# Patient Record
Sex: Female | Born: 1995 | Hispanic: Yes | Marital: Single | Smoking: Never smoker
Health system: Southern US, Community
[De-identification: ages and names within clinical notes are randomized; demographics above are authoritative.]

## PROBLEM LIST (undated history)

## (undated) DIAGNOSIS — J45909 Unspecified asthma, uncomplicated: Secondary | ICD-10-CM

## (undated) DIAGNOSIS — L0591 Pilonidal cyst without abscess: Secondary | ICD-10-CM

## (undated) DIAGNOSIS — Z8709 Personal history of other diseases of the respiratory system: Secondary | ICD-10-CM

## (undated) DIAGNOSIS — N926 Irregular menstruation, unspecified: Secondary | ICD-10-CM

## (undated) HISTORY — DX: Unspecified asthma, uncomplicated: J45.909

## (undated) HISTORY — PX: PILONIDAL CYST / SINUS EXCISION: SUR543

---
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
## 2017-06-07 ENCOUNTER — Encounter (HOSPITAL_COMMUNITY): Payer: Self-pay

## 2017-06-07 ENCOUNTER — Emergency Department (HOSPITAL_COMMUNITY)
Admission: EM | Admit: 2017-06-07 | Discharge: 2017-06-07 | Disposition: A | Discharge status: Home / Self Care | Payer: Medicaid Other | Attending: Emergency Medicine | Admitting: Emergency Medicine

## 2017-06-07 DIAGNOSIS — G43909 Migraine, unspecified, not intractable, without status migrainosus: Secondary | ICD-10-CM | POA: Diagnosis present

## 2017-06-07 DIAGNOSIS — R51 Headache: Secondary | ICD-10-CM | POA: Diagnosis not present

## 2017-06-07 DIAGNOSIS — R519 Headache, unspecified: Secondary | ICD-10-CM

## 2017-06-07 MED ORDER — IBUPROFEN 400 MG PO TABS
600.0000 mg | ORAL_TABLET | Freq: Once | ORAL | Status: AC
  Administered 2017-06-07: 600 mg via ORAL
  Filled 2017-06-07: qty 1

## 2017-06-07 MED ORDER — ONDANSETRON 4 MG PO TBDP
8.0000 mg | ORAL_TABLET | Freq: Once | ORAL | Status: AC
  Administered 2017-06-07: 8 mg via ORAL
  Filled 2017-06-07: qty 2

## 2017-06-07 NOTE — ED Notes (Signed)
Pt given 8oz Water for PO challenge

## 2017-06-07 NOTE — ED Triage Notes (Signed)
Pt here for headache, sts that hurts to move eyes and has had some nausea.

## 2017-06-07 NOTE — Discharge Instructions (Signed)
Take Ibuprofen 400mg  three times a day for headache Drink plenty of fluids Follow up with your doctor Return for worsening symptoms

## 2017-06-07 NOTE — ED Provider Notes (Signed)
[facility] Provider Note   CSN: [id number] Arrival date & time: 06/07/17  0439     History   Chief Complaint Chief Complaint  Patient presents with  . Migraine    HPI Carrie Morton is a 21 y.o. female who presents with a headache and nausea. PMH significant for underweight, insomnia. She states she has frequent headaches but this feels different from previous because it is in the back of her head and she has noticed a "lump" behind her head. She reports associated neck pain, pain with eye movement, and nausea. She denies fever, LOC, trauma, neck stiffness, acute onset, worst headache of life. She has not tried any medicines OTC. She denies URI symptoms, abdominal pain, or vomiting. She states headache is currently going away.   HPI  Past Medical History:  Diagnosis Date  . Asthma   . History of asthma    as a child  . Irregular periods   . Pilonidal cyst 07/2013    Patient Active Problem List   Diagnosis Date Noted  . Underweight 05/16/2015  . Insomnia 05/16/2015    Past Surgical History:  Procedure Laterality Date  . PILONIDAL CYST EXCISION N/A 08/14/2013   Procedure: EXCISION PILONIDAL CYST PEDIATRIC;  Surgeon: Judie PetitM. Leonia CoronaShuaib Farooqui, MD;  [facility];  Service: Pediatrics;  Laterality: N/A;    OB History    No data available       Home Medications    Prior to Admission medications   Medication Sig Start Date End Date Taking? Authorizing Provider  cetirizine (ZYRTEC) 10 MG tablet Take 1 tablet (10 mg total) by mouth daily as needed for allergies. Patient not taking: Reported on 06/07/2017 07/06/15   Voncille LoEttefagh, Kate, MD  omeprazole (PRILOSEC) 20 MG capsule Take 1 capsule (20 mg total) by mouth daily. Patient not taking: Reported on 06/07/2017 09/28/16   Antony MaduraHumes, Amauria Younts, PA-C  ondansetron (ZOFRAN ODT) 4 MG disintegrating tablet Take 1 tablet (4 mg total) by mouth every 8 (eight) hours as needed for nausea or vomiting. Patient not  taking: Reported on 06/07/2017 06/10/16   Maloy, Illene RegulusBrittany Nicole, NP  promethazine (PHENERGAN) 25 MG tablet Take 1 tablet (25 mg total) by mouth every 6 (six) hours as needed for nausea or vomiting. Patient not taking: Reported on 06/07/2017 09/28/16   Antony MaduraHumes, Kendrick Remigio, PA-C    Family History Family History  Problem Relation Age of Onset  . Hypertension Maternal Grandmother     Social History Social History  Substance Use Topics  . Smoking status: Never Smoker  . Smokeless tobacco: Never Used  . Alcohol use No     Allergies   Patient has no known allergies.   Review of Systems Review of Systems  Constitutional: Negative for chills and fever.  HENT: Negative for congestion, rhinorrhea and sore throat.   Eyes: Positive for pain (with movement). Negative for photophobia and visual disturbance.  Gastrointestinal: Positive for nausea. Negative for abdominal pain and vomiting.  Musculoskeletal: Positive for neck pain. Negative for neck stiffness.  Skin:       Lump behind neck  Neurological: Negative for syncope.     Physical Exam Updated Vital Signs BP 122/86 (BP Location: Right Arm)   Pulse 88   Temp 99.3 F (37.4 C) (Oral)   Resp 18   Ht 5\' 6"  (1.676 m)   Wt 45.4 kg (100 lb)   SpO2 100%   BMI 16.14 kg/m   Physical Exam  Constitutional: She is oriented to person, place,  and time. She appears well-developed and well-nourished. No distress.  Thin female in NAD lying on stretcher  HENT:  Head: Normocephalic and atraumatic.  Right Ear: Hearing, tympanic membrane, external ear and ear canal normal.  Left Ear: Hearing, external ear and ear canal normal.  Mouth/Throat: Uvula is midline, oropharynx is clear and moist and mucous membranes are normal.  Cerumen impaction of L TM  Eyes: Pupils are equal, round, and reactive to light. Conjunctivae are normal. Right eye exhibits no discharge. Left eye exhibits no discharge. No scleral icterus.  Neck: Normal range of motion.     Tender mobile lymph node over left mid-cervical region  Cardiovascular: Normal rate.   Pulmonary/Chest: Effort normal. No respiratory distress.  Abdominal: She exhibits no distension.  Neurological: She is alert and oriented to person, place, and time.  Lying on stretcher in NAD. GCS 15. Speaks in a clear voice. Cranial nerves II through XII grossly intact. 5/5 strength in all extremities. Sensation fully intact.  Bilateral finger-to-nose intact. Ambulatory    Skin: Skin is warm and dry.  Psychiatric: She has a normal mood and affect. Her behavior is normal.  Nursing note and vitals reviewed.    ED Treatments / Results  Labs (all labs ordered are listed, but only abnormal results are displayed) Labs Reviewed - No data to display  EKG  EKG Interpretation None       Radiology No results found.  Procedures Procedures (including critical care time)  Medications Ordered in ED Medications  ibuprofen (ADVIL,MOTRIN) tablet 600 mg (600 mg Oral Given 06/07/17 0621)  ondansetron (ZOFRAN-ODT) disintegrating tablet 8 mg (8 mg Oral Given 06/07/17 [id number])     Initial Impression / Assessment and Plan / ED Course  I have reviewed the triage vital signs and the nursing notes.  Pertinent labs & imaging results that were available during my care of the patient were reviewed by me and considered in my medical decision making (see chart for details).  21 year old female presents with a headache. Vitals are normal. Neuro exam unremarkable. Will give Ibuprofen and Zofran and PO challenge.  PO challenge was tolerated. She reports headache is better. Advised Ibuprofen and plenty of fluids. Follow up with PCP.   Final Clinical Impressions(s) / ED Diagnoses   Final diagnoses:  Bad headache    New Prescriptions New Prescriptions   No medications on file     Beryle QuantGekas, Jaileen Janelle Marie, PA-C 06/07/17 [phone]    Dione BoozeGlick, David, MD 06/07/17 [phone]

## 2017-06-29 ENCOUNTER — Encounter (HOSPITAL_COMMUNITY): Payer: Self-pay | Admitting: Emergency Medicine

## 2017-06-29 ENCOUNTER — Emergency Department (HOSPITAL_COMMUNITY): Payer: Medicaid Other

## 2017-06-29 ENCOUNTER — Emergency Department (HOSPITAL_COMMUNITY)
Admission: EM | Admit: 2017-06-29 | Discharge: 2017-06-30 | Disposition: A | Discharge status: Home / Self Care | Payer: Medicaid Other | Attending: Emergency Medicine | Admitting: Emergency Medicine

## 2017-06-29 DIAGNOSIS — Y93I9 Activity, other involving external motion: Secondary | ICD-10-CM | POA: Insufficient documentation

## 2017-06-29 DIAGNOSIS — S6991XA Unspecified injury of right wrist, hand and finger(s), initial encounter: Secondary | ICD-10-CM | POA: Diagnosis present

## 2017-06-29 DIAGNOSIS — J45909 Unspecified asthma, uncomplicated: Secondary | ICD-10-CM | POA: Diagnosis not present

## 2017-06-29 DIAGNOSIS — Y929 Unspecified place or not applicable: Secondary | ICD-10-CM | POA: Diagnosis not present

## 2017-06-29 DIAGNOSIS — S52501A Unspecified fracture of the lower end of right radius, initial encounter for closed fracture: Secondary | ICD-10-CM | POA: Diagnosis not present

## 2017-06-29 DIAGNOSIS — Y999 Unspecified external cause status: Secondary | ICD-10-CM | POA: Diagnosis not present

## 2017-06-29 DIAGNOSIS — S098XXA Other specified injuries of head, initial encounter: Secondary | ICD-10-CM | POA: Diagnosis not present

## 2017-06-29 NOTE — ED Notes (Signed)
Bed: WA07 Expected date:  Expected time:  Means of arrival:  Comments: 
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
## 2017-06-30 ENCOUNTER — Encounter (HOSPITAL_COMMUNITY): Payer: Self-pay

## 2017-06-30 MED ORDER — IBUPROFEN 600 MG PO TABS: 600.0000 mg | ORAL_TABLET | Freq: Four times a day (QID) | ORAL | 0 refills | Status: DC | PRN

## 2017-06-30 MED ORDER — IBUPROFEN 800 MG PO TABS
800.0000 mg | ORAL_TABLET | Freq: Once | ORAL | Status: AC
  Administered 2017-06-30: 800 mg via ORAL
  Filled 2017-06-30: qty 1
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
## 2017-06-30 NOTE — Progress Notes (Signed)
Orthopedic Tech Progress Note Patient Details:  Carrie Morton 17-Dec-1995 [id number]  Ortho Devices Type of Ortho Device: Sugartong splint Ortho Device/Splint Location: rue Ortho Device/Splint Interventions: Ordered, Application, Adjustment   Trinna Post 06/30/2017, 1:25 AM
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
## 2018-02-12 ENCOUNTER — Emergency Department (HOSPITAL_COMMUNITY): Payer: Medicaid Other

## 2018-02-12 ENCOUNTER — Encounter (HOSPITAL_COMMUNITY): Payer: Self-pay | Admitting: Emergency Medicine

## 2018-02-12 ENCOUNTER — Emergency Department (HOSPITAL_COMMUNITY)
Admission: EM | Admit: 2018-02-12 | Discharge: 2018-02-12 | Disposition: A | Discharge status: Home / Self Care | Payer: Medicaid Other | Attending: Emergency Medicine | Admitting: Emergency Medicine

## 2018-02-12 DIAGNOSIS — Y9301 Activity, walking, marching and hiking: Secondary | ICD-10-CM | POA: Diagnosis not present

## 2018-02-12 DIAGNOSIS — Y999 Unspecified external cause status: Secondary | ICD-10-CM | POA: Insufficient documentation

## 2018-02-12 DIAGNOSIS — W010XXA Fall on same level from slipping, tripping and stumbling without subsequent striking against object, initial encounter: Secondary | ICD-10-CM | POA: Diagnosis not present

## 2018-02-12 DIAGNOSIS — J45909 Unspecified asthma, uncomplicated: Secondary | ICD-10-CM | POA: Insufficient documentation

## 2018-02-12 DIAGNOSIS — Y929 Unspecified place or not applicable: Secondary | ICD-10-CM | POA: Insufficient documentation

## 2018-02-12 DIAGNOSIS — S52515A Nondisplaced fracture of left radial styloid process, initial encounter for closed fracture: Secondary | ICD-10-CM | POA: Insufficient documentation

## 2018-02-12 DIAGNOSIS — S6992XA Unspecified injury of left wrist, hand and finger(s), initial encounter: Secondary | ICD-10-CM | POA: Diagnosis present

## 2018-02-12 DIAGNOSIS — Z79899 Other long term (current) drug therapy: Secondary | ICD-10-CM | POA: Insufficient documentation

## 2018-02-12 MED ORDER — TRAMADOL HCL 50 MG PO TABS: 50.0000 mg | ORAL_TABLET | Freq: Four times a day (QID) | ORAL | 0 refills | Status: DC | PRN

## 2018-02-12 NOTE — Discharge Instructions (Addendum)
You were seen here today for left wrist pain.  You are found to have a radial styloid fracture. There is also possible that you have a transverse metaphyseal fracture but x-rays were not definitive. I have placed you in a sugar tong splint to allow for mobilization.  Please follow-up with hand specialist for further evaluation of your symptoms. Please follow cast and splint care at home. Elevate the extremity whenever you are not using it. Return sooner if you have any fever, numbness of your fingers, cannot move your fingers, changes in colors of your finger, redness of the skin surrounding the past or drainage coming from your cast.  For pain control you may take: 800mg  of ibuprofen (that is usually four 200mg  over the counter pills) up to 3 times a day (please take with food) and acetaminophen 975mg  (this is 3 normal strength, 325mg , over the counter pills) up to four times a day. Please do not take more than this. Do not drink alcohol or combine with other medications that have acetaminophen or Ibuprofen as an ingredient (Read the labels!).    For breakthrough pain you may take Ultram. Do not drink alcohol drive or operate heavy machinery when taking. You are being provided a prescription for opiates (also known as narcotics) for pain control on an ?as needed? basis.  Opiates can be addictive and should only be used when absolutely necessary for pain control when other alternatives do not work.  We recommend you only use them for the recommended amount of time and only as prescribed.  Please do not take with other sedative medications or alcohol.  Please do not drive, operate machinery, or make important decisions while taking opiates.  Please note that these medications can be addictive and have high abuse potential.  Please keep these medications locked away from children, teenagers or any family members with history of substance abuse. Additionally, these medications may cause constipation - take  over the counter stool softeners or add fiber to your diet to treat this (Metamucil, Psyllium Fiber, Colace, Miralax) Further refills will need to be obtained from your primary care doctor and will not be prescribed through the Emergency Department. You will test positive on most drug tests while taking this medication.
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
## 2018-02-12 NOTE — ED Notes (Signed)
Pt complains of wrist pain after falling and landing on L wrist. Some swelling and bruising noted to the L wrist.

## 2018-02-12 NOTE — ED Provider Notes (Signed)
[facility]   CSN: [id number] Arrival date & time: 02/12/18  1753     History   Chief Complaint Chief Complaint  Patient presents with  . Wrist Pain    HPI Carrie Morton is a 22 y.o. right-handed female who presents the emergency department today for left wrist pain.  Patient states that earlier this evening she was walking and tripped, going on her left wrist with the hand folded in.  She denies any head trauma or loss of consciousness.  She notes that she has been having pain on the radial aspect of the wrist and now has some swelling and ecchymosis associated with this.  He denies any numbness/tingling.  Her pain is worse with range of motion.  She has not taken any medication for this.  No open wounds.  She denies any hand pain.  HPI  Past Medical History:  Diagnosis Date  . Asthma   . History of asthma    as a child  . Irregular periods   . Pilonidal cyst 07/2013    Patient Active Problem List   Diagnosis Date Noted  . Underweight 05/16/2015  . Insomnia 05/16/2015    Past Surgical History:  Procedure Laterality Date  . PILONIDAL CYST / SINUS EXCISION    . PILONIDAL CYST EXCISION N/A 08/14/2013   Procedure: EXCISION PILONIDAL CYST PEDIATRIC;  Surgeon: Judie PetitM. Leonia CoronaShuaib Farooqui, MD;  Location:  [facility];  Service: Pediatrics;  Laterality: N/A;     OB History   None      Home Medications    Prior to Admission medications   Medication Sig Start Date End Date Taking? Authorizing Provider  ibuprofen (ADVIL,MOTRIN) 600 MG tablet Take 1 tablet (600 mg total) by mouth every 6 (six) hours as needed. 06/30/17   Antony MaduraHumes, Kelly, PA-C    Family History Family History  Problem Relation Age of Onset  . Hypertension Maternal Grandmother     Social History Social History   Tobacco Use  . Smoking status: Never Smoker  . Smokeless tobacco: Never Used  Substance Use Topics  . Alcohol use: No  .  Drug use: No     Allergies   Patient has no known allergies.   Review of Systems Review of Systems  All other systems reviewed and are negative.    Physical Exam Updated Vital Signs BP 108/81 (BP Location: Right Arm)   Pulse 94   Temp 98.3 F (36.8 C) (Oral)   Resp 18   SpO2 100%   Physical Exam  Constitutional: She appears well-developed and well-nourished.  HENT:  Head: Normocephalic and atraumatic.  Right Ear: External ear normal.  Left Ear: External ear normal.  Eyes: Conjunctivae are normal. Right eye exhibits no discharge. Left eye exhibits no discharge. No scleral icterus.  Cardiovascular:  Pulses:      Radial pulses are 2+ on the right side, and 2+ on the left side.  Pulmonary/Chest: Effort normal. No respiratory distress.  Musculoskeletal:  Left wrist: There is noted ecchymosis on the radial aspect of the distal wrist.  There is also tenderness palpation.  She notes pain with radial deviation as well as flexion and extension.  There is no open wounds noted.  Compartments soft. Left hand: No gross deformities, skin intact. Fingers appear normal.  No hand TTP. No snuffbox TTP. Finger adduction/abduction intact with 5/5 strength.  Thumb opposition intact. Full active and resisted ROM to flexion/extension at MCP, PIP and DIP of all  fingers.  FDS/FDP intact. Radial artery 2+ with <2sec cap refill. SILT in M/U/R distributions. Grip 5/5 strength.   Neurological: She is alert. She has normal strength. No sensory deficit.  Skin: Skin is warm and dry. Capillary refill takes less than 2 seconds. Ecchymosis noted. No rash noted. No erythema. No pallor.  Psychiatric: She has a normal mood and affect.  Nursing note and vitals reviewed.    ED Treatments / Results  Labs (all labs ordered are listed, but only abnormal results are displayed) Labs Reviewed - No data to display  EKG None  Radiology Dg Wrist Complete Left  Result Date: 02/12/2018 CLINICAL DATA:  Wrist pain  after fall onto outstretched hand. EXAM: LEFT WRIST - COMPLETE 3+ VIEW COMPARISON:  None. FINDINGS: Fracture of the radial styloid noted. There is also a lucent band in the distal radial metaphysis with some associated cortical irregularity which could be from a remote fracture or a subtle new transverse metaphyseal fracture. No additional fractures are identified. IMPRESSION: 1. Small radial styloid fracture. 2. Unusual appearance of the distal radial metaphysis with transverse indistinct band of lucency and some lateral cortical irregularity in the vicinity of the original growth plate. This could be due to an old fracture or conceivably an unusual new transverse metaphyseal fracture. Electronically Signed   By: Gaylyn Rong M.D.   On: 02/12/2018 19:14    Procedures Procedures (including critical care time) SPLINT APPLICATION Date/Time: 7:58 PM Authorized by: Jacinto Halim Consent: Verbal consent obtained. Risks and benefits: risks, benefits and alternatives were discussed Consent given by: patient Splint applied by: orthopedic technician Location details: left wrist Splint type: sugar tong splint Supplies used: sugar tong splint Post-procedure: The splinted body part was neurovascularly unchanged following the procedure. Patient tolerance: Patient tolerated the procedure well with no immediate complications.   Medications Ordered in ED Medications - No data to display   Initial Impression / Assessment and Plan / ED Course  I have reviewed the triage vital signs and the nursing notes.  Pertinent labs & imaging results that were available during my care of the patient were reviewed by me and considered in my medical decision making (see chart for details).     22 y.o. female with left, non-dominant wrist pain after fall today with hand.  No open wounds.  She is neurovascular intact.  Exam as above.  X-ray with radial styloid fracture.  There is also noted to be a possible new  transverse metaphyseal fracture.  Will place in a sugar tong splint to cover for both.  On my review does not appear to require reduction prior to splinting.  Patient offered pain medication department but denies.  Conservative therapy recommended and discussed.  Patient be discharged home with follow-up with hand (Gramig).  Strict return precautions are discussed.  Patient expresses understanding and agreement with plan.  Final Clinical Impressions(s) / ED Diagnoses   Final diagnoses:  Closed nondisplaced fracture of styloid process of left radius, initial encounter    ED Discharge Orders        Ordered    traMADol (ULTRAM) 50 MG tablet  Every 6 hours PRN     02/12/18 2000       Jacinto Halim, PA-C 02/12/18 2021    Melene Plan, DO 02/12/18 2044

## 2018-02-12 NOTE — Progress Notes (Signed)
Orthopedic Tech Progress Note Patient Details:  Carrie Morton 11/25/1995 [id number]  Ortho Devices Type of Ortho Device: Arm sling, Sugartong splint Ortho Device/Splint Location: left Ortho Device/Splint Interventions: Application   Post Interventions Patient Tolerated: Well Instructions Provided: Adjustment of device, Care of device   Alvina ChouWilliams, Sharita Bienaime C 02/12/2018, 8:17 PM

## 2018-02-12 NOTE — ED Provider Notes (Signed)
Patient placed in Quick Look pathway, seen and evaluated   Chief Complaint: wrist pain  HPI:   Carrie Morton is a 22 y.o. female who presents to the ED with left wrist pain s/p fall and landing wrong. Patient denies other injuries.   ROS: M/S: left wrist pain  Physical Exam:  BP 108/81 (BP Location: Right Arm)   Pulse 94   Temp 98.3 F (36.8 C) (Oral)   Resp 18   SpO2 100%    Gen: No distress  Neuro: Awake and Alert  Skin: Warm and dry  M/S: left wrist with swelling and tenderness to the radial aspect.     Focused Exam:    Initiation of care has begun. The patient has been counseled on the process, plan, and necessity for staying for the completion/evaluation, and the remainder of the medical screening examination    Janne Napoleoneese, Lorilei Horan M, NP 02/12/18 [id number]    Tilden Fossaees, Elizabeth, MD 02/13/18 [phone]

## 2018-02-12 NOTE — ED Notes (Signed)
Ortho tech paged  

## 2018-08-11 ENCOUNTER — Emergency Department (HOSPITAL_COMMUNITY)
Admission: EM | Admit: 2018-08-11 | Discharge: 2018-08-11 | Disposition: A | Discharge status: Home / Self Care | Payer: Medicaid Other | Attending: Emergency Medicine | Admitting: Emergency Medicine

## 2018-08-11 ENCOUNTER — Encounter (HOSPITAL_COMMUNITY): Payer: Self-pay | Admitting: Emergency Medicine

## 2018-08-11 ENCOUNTER — Emergency Department (HOSPITAL_COMMUNITY): Payer: Medicaid Other

## 2018-08-11 ENCOUNTER — Other Ambulatory Visit: Payer: Self-pay

## 2018-08-11 DIAGNOSIS — S62647A Nondisplaced fracture of proximal phalanx of left little finger, initial encounter for closed fracture: Secondary | ICD-10-CM | POA: Insufficient documentation

## 2018-08-11 DIAGNOSIS — S6992XA Unspecified injury of left wrist, hand and finger(s), initial encounter: Secondary | ICD-10-CM | POA: Diagnosis present

## 2018-08-11 DIAGNOSIS — Y929 Unspecified place or not applicable: Secondary | ICD-10-CM | POA: Diagnosis not present

## 2018-08-11 DIAGNOSIS — Z79899 Other long term (current) drug therapy: Secondary | ICD-10-CM | POA: Diagnosis not present

## 2018-08-11 DIAGNOSIS — Y9389 Activity, other specified: Secondary | ICD-10-CM | POA: Insufficient documentation

## 2018-08-11 DIAGNOSIS — Y998 Other external cause status: Secondary | ICD-10-CM | POA: Diagnosis not present

## 2018-08-11 DIAGNOSIS — J45909 Unspecified asthma, uncomplicated: Secondary | ICD-10-CM | POA: Diagnosis not present

## 2018-08-11 MED ORDER — IBUPROFEN 600 MG PO TABS: 600.0000 mg | ORAL_TABLET | Freq: Four times a day (QID) | ORAL | 0 refills | Status: AC | PRN

## 2018-08-11 MED ORDER — ACETAMINOPHEN 500 MG PO TABS: 500.0000 mg | ORAL_TABLET | Freq: Four times a day (QID) | ORAL | 0 refills | Status: DC | PRN

## 2018-08-11 NOTE — Discharge Instructions (Addendum)
Use ice 3-4 times daily alternating 20 min on, 20 min off. Wear your splint at all times except when washing your hands. You can take ibuprofen and Tylenol as prescribed as needed for your pain. Please follow up with Dr. Eulah Pont for further evaluation and treatment of your finger fracture. Please return to the emergency department if you develop any new or worsening symptoms.
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
## 2018-08-11 NOTE — ED Notes (Signed)
Pt discharged from ED; instructions provided and scripts given; Pt encouraged to return to ED if symptoms worsen and to f/u with PCP; Pt verbalized understanding of all instructions 
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
## 2018-08-21 ENCOUNTER — Other Ambulatory Visit: Payer: Self-pay | Admitting: Orthopedic Surgery

## 2018-08-22 ENCOUNTER — Encounter (HOSPITAL_BASED_OUTPATIENT_CLINIC_OR_DEPARTMENT_OTHER): Payer: Self-pay | Admitting: *Deleted

## 2018-08-22 ENCOUNTER — Other Ambulatory Visit: Payer: Self-pay

## 2018-08-23 ENCOUNTER — Encounter (HOSPITAL_BASED_OUTPATIENT_CLINIC_OR_DEPARTMENT_OTHER): Payer: Self-pay

## 2018-08-23 ENCOUNTER — Encounter (HOSPITAL_BASED_OUTPATIENT_CLINIC_OR_DEPARTMENT_OTHER)
Admission: RE | Disposition: A | Discharge status: Home / Self Care | Payer: Self-pay | Source: Ambulatory Visit | Attending: Orthopedic Surgery

## 2018-08-23 ENCOUNTER — Other Ambulatory Visit: Payer: Self-pay

## 2018-08-23 ENCOUNTER — Ambulatory Visit (HOSPITAL_BASED_OUTPATIENT_CLINIC_OR_DEPARTMENT_OTHER): Payer: Medicaid Other | Admitting: Certified Registered"

## 2018-08-23 ENCOUNTER — Ambulatory Visit (HOSPITAL_BASED_OUTPATIENT_CLINIC_OR_DEPARTMENT_OTHER)
Admission: RE | Admit: 2018-08-23 | Discharge: 2018-08-23 | Disposition: A | Discharge status: Home / Self Care | Payer: Medicaid Other | Source: Ambulatory Visit | Attending: Orthopedic Surgery | Admitting: Orthopedic Surgery

## 2018-08-23 DIAGNOSIS — Y929 Unspecified place or not applicable: Secondary | ICD-10-CM | POA: Diagnosis not present

## 2018-08-23 DIAGNOSIS — X500XXA Overexertion from strenuous movement or load, initial encounter: Secondary | ICD-10-CM | POA: Insufficient documentation

## 2018-08-23 DIAGNOSIS — S62617A Displaced fracture of proximal phalanx of left little finger, initial encounter for closed fracture: Secondary | ICD-10-CM | POA: Insufficient documentation

## 2018-08-23 DIAGNOSIS — J45909 Unspecified asthma, uncomplicated: Secondary | ICD-10-CM | POA: Diagnosis not present

## 2018-08-23 HISTORY — PX: FINGER CLOSED REDUCTION: SHX1633

## 2018-08-23 LAB — POCT PREGNANCY, URINE: Preg Test, Ur: NEGATIVE

## 2018-08-23 SURGERY — CLOSED REDUCTION, FRACTURE, METACARPAL BONE
Anesthesia: Monitor Anesthesia Care | Site: Finger | Laterality: Left

## 2018-08-23 MED ORDER — ONDANSETRON HCL 4 MG/2ML IJ SOLN
INTRAMUSCULAR | Status: DC | PRN
  Administered 2018-08-23: 4 mg via INTRAVENOUS

## 2018-08-23 MED ORDER — CEFAZOLIN SODIUM-DEXTROSE 2-4 GM/100ML-% IV SOLN
INTRAVENOUS | Status: AC
  Filled 2018-08-23: qty 100

## 2018-08-23 MED ORDER — HYDROCODONE-ACETAMINOPHEN 5-325 MG PO TABS: ORAL_TABLET | ORAL | 0 refills | Status: AC

## 2018-08-23 MED ORDER — LIDOCAINE HCL (PF) 1 % IJ SOLN
INTRAMUSCULAR | Status: AC
  Filled 2018-08-23: qty 30

## 2018-08-23 MED ORDER — FENTANYL CITRATE (PF) 100 MCG/2ML IJ SOLN
INTRAMUSCULAR | Status: AC
  Filled 2018-08-23: qty 2

## 2018-08-23 MED ORDER — LIDOCAINE HCL (CARDIAC) PF 100 MG/5ML IV SOSY
PREFILLED_SYRINGE | INTRAVENOUS | Status: DC | PRN
  Administered 2018-08-23: 30 mg via INTRAVENOUS

## 2018-08-23 MED ORDER — PROPOFOL 500 MG/50ML IV EMUL
INTRAVENOUS | Status: DC | PRN
  Administered 2018-08-23: 50 ug/kg/min via INTRAVENOUS

## 2018-08-23 MED ORDER — LIDOCAINE 2% (20 MG/ML) 5 ML SYRINGE
INTRAMUSCULAR | Status: AC
  Filled 2018-08-23: qty 5

## 2018-08-23 MED ORDER — SCOPOLAMINE 1 MG/3DAYS TD PT72: 1.0000 | MEDICATED_PATCH | Freq: Once | TRANSDERMAL | Status: DC | PRN

## 2018-08-23 MED ORDER — BUPIVACAINE HCL (PF) 0.25 % IJ SOLN
INTRAMUSCULAR | Status: DC | PRN
  Administered 2018-08-23: 4 mL

## 2018-08-23 MED ORDER — MIDAZOLAM HCL 2 MG/2ML IJ SOLN
1.0000 mg | INTRAMUSCULAR | Status: DC | PRN
  Administered 2018-08-23: 2 mg via INTRAVENOUS

## 2018-08-23 MED ORDER — HYDROMORPHONE HCL 1 MG/ML IJ SOLN: 0.2500 mg | INTRAMUSCULAR | Status: DC | PRN

## 2018-08-23 MED ORDER — LACTATED RINGERS IV SOLN
INTRAVENOUS | Status: DC
  Administered 2018-08-23: 12:00:00 via INTRAVENOUS

## 2018-08-23 MED ORDER — LIDOCAINE HCL 2 % IJ SOLN
INTRAMUSCULAR | Status: AC
  Filled 2018-08-23: qty 20

## 2018-08-23 MED ORDER — BUPIVACAINE HCL (PF) 0.25 % IJ SOLN
INTRAMUSCULAR | Status: AC
  Filled 2018-08-23: qty 30

## 2018-08-23 MED ORDER — PROPOFOL 500 MG/50ML IV EMUL
INTRAVENOUS | Status: AC
  Filled 2018-08-23: qty 50

## 2018-08-23 MED ORDER — LIDOCAINE HCL 2 % IJ SOLN
INTRAMUSCULAR | Status: DC | PRN
  Administered 2018-08-23: 4 mL

## 2018-08-23 MED ORDER — FENTANYL CITRATE (PF) 100 MCG/2ML IJ SOLN
50.0000 ug | INTRAMUSCULAR | Status: DC | PRN
  Administered 2018-08-23: 50 ug via INTRAVENOUS

## 2018-08-23 MED ORDER — CHLORHEXIDINE GLUCONATE 4 % EX LIQD: 60.0000 mL | Freq: Once | CUTANEOUS | Status: DC

## 2018-08-23 MED ORDER — MIDAZOLAM HCL 2 MG/2ML IJ SOLN
INTRAMUSCULAR | Status: AC
  Filled 2018-08-23: qty 2

## 2018-08-23 MED ORDER — CEFAZOLIN SODIUM-DEXTROSE 2-4 GM/100ML-% IV SOLN
2.0000 g | INTRAVENOUS | Status: AC
  Administered 2018-08-23: 2 g via INTRAVENOUS

## 2018-08-23 SURGICAL SUPPLY — 59 items
BANDAGE ACE 3X5.8 VEL STRL LF (GAUZE/BANDAGES/DRESSINGS) IMPLANT
BLADE MINI RND TIP GREEN BEAV (BLADE) IMPLANT
BLADE SURG 15 STRL LF DISP TIS (BLADE) ×1 IMPLANT
BLADE SURG 15 STRL SS (BLADE) ×2
BNDG ELASTIC 2X5.8 VLCR STR LF (GAUZE/BANDAGES/DRESSINGS) ×3 IMPLANT
BNDG ESMARK 4X9 LF (GAUZE/BANDAGES/DRESSINGS) IMPLANT
BNDG GAUZE ELAST 4 BULKY (GAUZE/BANDAGES/DRESSINGS) ×3 IMPLANT
CHLORAPREP W/TINT 26ML (MISCELLANEOUS) ×3 IMPLANT
CORD BIPOLAR FORCEPS 12FT (ELECTRODE) IMPLANT
COVER BACK TABLE 60X90IN (DRAPES) ×3 IMPLANT
COVER MAYO STAND STRL (DRAPES) ×3 IMPLANT
COVER WAND RF STERILE (DRAPES) IMPLANT
CUFF TOURN SGL LL 12 (TOURNIQUET CUFF) ×3 IMPLANT
CUFF TOURNIQUET SINGLE 18IN (TOURNIQUET CUFF) IMPLANT
DRAPE EXTREMITY T 121X128X90 (DRAPE) ×3 IMPLANT
DRAPE OEC MINIVIEW 54X84 (DRAPES) ×3 IMPLANT
DRAPE SURG 17X23 STRL (DRAPES) ×3 IMPLANT
GAUZE SPONGE 4X4 12PLY STRL (GAUZE/BANDAGES/DRESSINGS) ×3 IMPLANT
GAUZE XEROFORM 1X8 LF (GAUZE/BANDAGES/DRESSINGS) ×3 IMPLANT
GLOVE BIO SURGEON STRL SZ 6.5 (GLOVE) ×2 IMPLANT
GLOVE BIO SURGEON STRL SZ7.5 (GLOVE) ×3 IMPLANT
GLOVE BIO SURGEONS STRL SZ 6.5 (GLOVE) ×1
GLOVE BIOGEL PI IND STRL 7.0 (GLOVE) ×1 IMPLANT
GLOVE BIOGEL PI IND STRL 8 (GLOVE) ×1 IMPLANT
GLOVE BIOGEL PI IND STRL 8.5 (GLOVE) IMPLANT
GLOVE BIOGEL PI INDICATOR 7.0 (GLOVE) ×2
GLOVE BIOGEL PI INDICATOR 8 (GLOVE) ×2
GLOVE BIOGEL PI INDICATOR 8.5 (GLOVE)
GLOVE SURG ORTHO 8.0 STRL STRW (GLOVE) IMPLANT
GOWN STRL REUS W/ TWL XL LVL3 (GOWN DISPOSABLE) ×1 IMPLANT
GOWN STRL REUS W/TWL XL LVL3 (GOWN DISPOSABLE) ×5 IMPLANT
K-WIRE .035X4 (WIRE) IMPLANT
K-WIRE PROS .028 4 (WIRE) ×3 IMPLANT
NEEDLE HYPO 22GX1.5 SAFETY (NEEDLE) IMPLANT
NEEDLE HYPO 25X1 1.5 SAFETY (NEEDLE) ×3 IMPLANT
NS IRRIG 1000ML POUR BTL (IV SOLUTION) IMPLANT
PACK BASIN DAY SURGERY FS (CUSTOM PROCEDURE TRAY) ×3 IMPLANT
PAD CAST 3X4 CTTN HI CHSV (CAST SUPPLIES) ×1 IMPLANT
PAD CAST 4YDX4 CTTN HI CHSV (CAST SUPPLIES) IMPLANT
PADDING CAST ABS 4INX4YD NS (CAST SUPPLIES) ×2
PADDING CAST ABS COTTON 4X4 ST (CAST SUPPLIES) ×1 IMPLANT
PADDING CAST COTTON 3X4 STRL (CAST SUPPLIES) ×2
PADDING CAST COTTON 4X4 STRL (CAST SUPPLIES)
SLEEVE SCD COMPRESS KNEE MED (MISCELLANEOUS) IMPLANT
SPLINT PLASTER CAST XFAST 3X15 (CAST SUPPLIES) ×16 IMPLANT
SPLINT PLASTER CAST XFAST 4X15 (CAST SUPPLIES) IMPLANT
SPLINT PLASTER XTRA FAST SET 4 (CAST SUPPLIES)
SPLINT PLASTER XTRA FASTSET 3X (CAST SUPPLIES) ×32
STOCKINETTE 4X48 STRL (DRAPES) ×3 IMPLANT
SUT ETHILON 3 0 PS 1 (SUTURE) IMPLANT
SUT ETHILON 4 0 PS 2 18 (SUTURE) IMPLANT
SUT MERSILENE 4 0 P 3 (SUTURE) IMPLANT
SUT VIC AB 3-0 PS1 18 (SUTURE)
SUT VIC AB 3-0 PS1 18XBRD (SUTURE) IMPLANT
SUT VICRYL 4-0 PS2 18IN ABS (SUTURE) IMPLANT
SYR BULB 3OZ (MISCELLANEOUS) IMPLANT
SYR CONTROL 10ML LL (SYRINGE) ×3 IMPLANT
TOWEL GREEN STERILE FF (TOWEL DISPOSABLE) ×3 IMPLANT
UNDERPAD 30X30 (UNDERPADS AND DIAPERS) IMPLANT

## 2018-08-23 NOTE — Discharge Instructions (Addendum)

## 2018-08-23 NOTE — Op Note (Signed)
NAME: Carrie Morton MEDICAL RECORD NO: [id number] DATE OF BIRTH: 27-Apr-1996 FACILITY: [facility] LOCATION: [facility] PHYSICIAN: Tami Ribas, MD   OPERATIVE REPORT   DATE OF PROCEDURE: 08/23/18    PREOPERATIVE DIAGNOSIS:   Left small finger proximal phalanx intra-articular condyle fracture   POSTOPERATIVE DIAGNOSIS:   Left small finger proximal phalanx intra-articular condyle fracture   PROCEDURE:   Closed reduction pin fixation left small finger proximal phalanx intra-articular condyle fracture   SURGEON:  Betha Loa, M.D.   ASSISTANT: none   ANESTHESIA:  Local with sedation   INTRAVENOUS FLUIDS:  Per anesthesia flow sheet.   ESTIMATED BLOOD LOSS:  Minimal.   COMPLICATIONS:  None.   SPECIMENS:  none   TOURNIQUET TIME:   None   DISPOSITION:  Stable to PACU.   INDICATIONS: 22 year old female states she injured her left small finger while lifting something heavy.  Radiograph were taken revealing a small finger proximal phalanx intra-articular condyle fracture on the radial side.  Recommended operative fixation.  She wishes to proceed. Risks, benefits and alternatives of surgery were discussed including the risks of blood loss, infection, damage to nerves, vessels, tendons, ligaments, bone for surgery, need for additional surgery, complications with wound healing, continued pain, nonunion, malunion, stiffness.  She voiced understanding of these risks and elected to proceed.  OPERATIVE COURSE:  After being identified preoperatively by myself,  the patient and I agreed on the procedure and site of the procedure.  The surgical site was marked.  Surgical consent had been signed. She was given IV Ancef as preoperative antibiotic prophylaxis. She was transferred to the operating room and placed on the operating table in supine position with the Left upper extremity on an arm board.  Sedation was induced by the anesthesiologist.a digital block was performed  with 8 cc of half-and-half solution 2% plain lidocaine and quarter percent plain Marcaine.  Left upper extremity was prepped and draped in normal sterile orthopedic fashion.  A surgical pause was performed between the surgeons, anesthesia, and operating room staff and all were in agreement as to the patient, procedure, and site of procedure.  Tourniquet was not inflated.    C-arm was used in AP lateral oblique projections throughout the case.  A closed reduction of the left small finger proximal phalanx intra-articular radial condyle fracture was performed.  Two 0.028 inch K wires were then used to stabilize the fracture.  C-arm was used in AP lateral projections to ensure appropriate reduction position of hardware which was the case.  The wrist was placed through tenodesis and there is no scissoring.  The pin sites were dressed with sterile Xeroform 4 x 4 and wrapped with a Kerlix bandage.  A volar dorsal slab splint clean the long ring and small fingers was placed with the MPs flexed the IP is extended.  This was wrapped with Kerlix and Ace bandage. Fingertips were pink with brisk capillary refill at the end of the procedure.  The operative  drapes were broken down.  The patient was awoken from anesthesia safely.  She was transferred back to the stretcher and taken to PACU in stable condition.  I will see her back in the office in 1 week for postoperative followup.  I will give her a prescription for Norco 5/325 1-2 tabs PO q6 hours prn pain, dispense # 20.   Betha Loa, MD Electronically signed, 08/23/18

## 2018-08-23 NOTE — Anesthesia Preprocedure Evaluation (Addendum)
Anesthesia Evaluation  Patient identified by MRN, date of birth, ID band Patient awake    Reviewed: Allergy & Precautions, H&P , NPO status , Patient's Chart, lab work & pertinent test results  Airway Mallampati: II  TM Distance: >3 FB Neck ROM: Full    Dental no notable dental hx. (+) Teeth Intact, Dental Advisory Given   Pulmonary    Pulmonary exam normal breath sounds clear to auscultation       Cardiovascular negative cardio ROS   Rhythm:Regular Rate:Normal     Neuro/Psych negative neurological ROS  negative psych ROS   GI/Hepatic negative GI ROS, Neg liver ROS,   Endo/Other  negative endocrine ROS  Renal/GU negative Renal ROS  negative genitourinary   Musculoskeletal   Abdominal   Peds  Hematology negative hematology ROS (+)   Anesthesia Other Findings   Reproductive/Obstetrics negative OB ROS                            Anesthesia Physical Anesthesia Plan  ASA: I  Anesthesia Plan: MAC   Post-op Pain Management:    Induction: Intravenous  PONV Risk Score and Plan: 4 or greater and Ondansetron, Dexamethasone and Midazolam  Airway Management Planned: Simple Face Mask  Additional Equipment:   Intra-op Plan:   Post-operative Plan:   Informed Consent: I have reviewed the patients History and Physical, chart, labs and discussed the procedure including the risks, benefits and alternatives for the proposed anesthesia with the patient or authorized representative who has indicated his/her understanding and acceptance.   Dental advisory given  Plan Discussed with: CRNA  Anesthesia Plan Comments:        Anesthesia Quick Evaluation

## 2018-08-23 NOTE — Transfer of Care (Signed)
Immediate Anesthesia Transfer of Care Note  Patient: Carrie Morton  Procedure(s) Performed: LEFT SMALL FINGER CLOSED REDUCTION AND PIN FIXATION (Left Finger)  Patient Location: PACU  Anesthesia Type:MAC  Level of Consciousness: awake, alert , oriented and patient cooperative  Airway & Oxygen Therapy: Patient Spontanous Breathing and Patient connected to face mask oxygen  Post-op Assessment: Report given to RN and Post -op Vital signs reviewed and stable  Post vital signs: Reviewed and stable  Last Vitals:  Vitals Value Taken Time  BP    Temp    Pulse    Resp    SpO2      Last Pain:  Vitals:   08/23/18 1132  TempSrc: Oral  PainSc: 0-No pain         Complications: No apparent anesthesia complications

## 2018-08-23 NOTE — H&P (Signed)
  Carrie Morton is an 22 y.o. female.   Chief Complaint: left small finger fracture HPI: 22 yo female states she injured left small finger 9/29 when lifting heavy item.  Seen in office where XR revealed proximal phalanx fracture.  She wishes to have operative fixation.  Allergies: No Known Allergies  Past Medical History:  Diagnosis Date  . Asthma   . History of asthma    as a child  . Irregular periods   . Pilonidal cyst 07/2013    Past Surgical History:  Procedure Laterality Date  . PILONIDAL CYST / SINUS EXCISION    . PILONIDAL CYST EXCISION N/A 08/14/2013   Procedure: EXCISION PILONIDAL CYST PEDIATRIC;  Surgeon: Judie Petit. Leonia Corona, MD;  Location: [facility];  Service: Pediatrics;  Laterality: N/A;    Family History: Family History  Problem Relation Age of Onset  . Hypertension Maternal Grandmother     Social History:   reports that she has never smoked. She has never used smokeless tobacco. She reports that she does not drink alcohol or use drugs.  Medications: Medications Prior to Admission  Medication Sig Dispense Refill  . etonogestrel (NEXPLANON) 68 MG IMPL implant 1 each by Subdermal route once. Left arm 02/2017    . ibuprofen (ADVIL,MOTRIN) 600 MG tablet Take 1 tablet (600 mg total) by mouth every 6 (six) hours as needed. 30 tablet 0  . Melatonin 3 MG TBDP Take 3 mg by mouth at bedtime.    Marland Kitchen acetaminophen (TYLENOL) 500 MG tablet Take 1 tablet (500 mg total) by mouth every 6 (six) hours as needed. 30 tablet 0  . diphenhydrAMINE (BENADRYL) 25 MG tablet Take 25 mg by mouth at bedtime.      Results for orders placed or performed during the hospital encounter of 08/23/18 (from the past 48 hour(s))  Pregnancy, urine POC     Status: None   Collection Time: 08/23/18 11:16 AM  Result Value Ref Range   Preg Test, Ur NEGATIVE NEGATIVE    Comment:        THE SENSITIVITY OF THIS METHODOLOGY IS >24 mIU/mL     No results found.   A comprehensive  review of systems was negative.  Blood pressure 115/70, pulse (!) 122, temperature 98.9 F (37.2 C), temperature source Oral, resp. rate 18, height 5\' 6"  (1.676 m), weight 41.3 kg, last menstrual period 02/03/2018, SpO2 100 %.  General appearance: alert, cooperative and appears stated age Head: Normocephalic, without obvious abnormality, atraumatic Neck: supple, symmetrical, trachea midline Cardio: regular rate and rhythm Resp: clear to auscultation bilaterally Extremities: Intact sensation and capillary refill all digits.  +epl/fpl/io.  No wounds.  Pulses: 2+ and symmetric Skin: Skin color, texture, turgor normal. No rashes or lesions Neurologic: Grossly normal Incision/Wound: none  Assessment/Plan Left small finger proximal phalanx condyle fracture.  Plan reduction and pinning.  Non operative and operative treatment options have been discussed with the patient and patient wishes to proceed with operative treatment. Risks, benefits, and alternatives of surgery have been discussed and the patient agrees with the plan of care.   Betha Loa 08/23/2018, 12:15 PM

## 2018-08-23 NOTE — Anesthesia Postprocedure Evaluation (Signed)
Anesthesia Post Note  Patient: Carrie Morton  Procedure(s) Performed: LEFT SMALL FINGER CLOSED REDUCTION AND PIN FIXATION (Left Finger)     Patient location during evaluation: PACU Anesthesia Type: MAC Level of consciousness: awake and alert Pain management: pain level controlled Vital Signs Assessment: post-procedure vital signs reviewed and stable Respiratory status: spontaneous breathing, nonlabored ventilation and respiratory function stable Cardiovascular status: stable and blood pressure returned to baseline Postop Assessment: no apparent nausea or vomiting Anesthetic complications: no    Last Vitals:  Vitals:   08/23/18 1331 08/23/18 1345  BP: 105/69 112/79  Pulse: (!) 102 94  Resp: 18 15  Temp:    SpO2: 100% 100%    Last Pain:  Vitals:   08/23/18 1345  TempSrc:   PainSc: 0-No pain                 Ortha Metts,W. EDMOND

## 2018-08-23 NOTE — Anesthesia Procedure Notes (Signed)
Procedure Name: MAC Date/Time: 08/23/2018 1:02 PM Performed by: Signe Colt, CRNA Pre-anesthesia Checklist: Patient identified, Suction available, Emergency Drugs available and Timeout performed Patient Re-evaluated:Patient Re-evaluated prior to induction Oxygen Delivery Method: Simple face mask

## 2018-08-27 ENCOUNTER — Encounter (HOSPITAL_BASED_OUTPATIENT_CLINIC_OR_DEPARTMENT_OTHER): Payer: Self-pay | Admitting: Orthopedic Surgery
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
## 2020-04-05 ENCOUNTER — Ambulatory Visit: Payer: Self-pay

## 2020-05-05 ENCOUNTER — Other Ambulatory Visit: Payer: Self-pay | Admitting: Internal Medicine

## 2020-05-05 ENCOUNTER — Ambulatory Visit
Admission: RE | Admit: 2020-05-05 | Discharge: 2020-05-05 | Disposition: A | Discharge status: Home / Self Care | Payer: Medicaid Other | Source: Ambulatory Visit | Attending: Internal Medicine | Admitting: Internal Medicine

## 2020-05-05 DIAGNOSIS — J4530 Mild persistent asthma, uncomplicated: Secondary | ICD-10-CM

## 2020-05-19 ENCOUNTER — Other Ambulatory Visit: Payer: Self-pay

## 2020-05-19 ENCOUNTER — Encounter: Payer: Medicaid Other | Attending: Internal Medicine | Admitting: Dietician

## 2020-05-19 ENCOUNTER — Encounter: Payer: Self-pay | Admitting: Dietician

## 2020-05-19 DIAGNOSIS — R634 Abnormal weight loss: Secondary | ICD-10-CM | POA: Insufficient documentation

## 2020-05-19 NOTE — Progress Notes (Signed)
Medical Nutrition Therapy   Primary concerns today: decreased appetite  Referral diagnosis: R63.4- abnormal weight loss Preferred learning style: no preference indicated Learning readiness: contemplating   NUTRITION ASSESSMENT   Anthropometrics  Weight: 96.2 lbs Height: 65 in BMI: 16 kg/m2   Clinical Medical Hx: asthma Medications: N/A  Notable Signs/Symptoms: weight loss, lack of appetite, nausea (occasionally)  Lifestyle & Dietary Hx Patient reports no food allergies, but states she has lactose intolerance so avoids dairy. Typical meal pattern is 1 meal per day plus maybe 1-2 snacks. Does not eat breakfast. Foods reported eaten include salmon, meat, tofu, rice, beans, fruit, and desserts. May snack on fruit, chips, or trail mix.   States she had COVID-19 in March and since has had especially decreased appetite. States she would like to gain a little weight. States she sometimes feels nauseous, especially in the mornings, which is why she avoids eating breakfast.   Estimated daily fluid intake: 64 oz Supplements: N/A  Current average weekly physical activity: walks dog daily   24-Hr Dietary Recall First Meal: - Snack: - Second Meal: salmon + rice  Snack: fruit (or trail mix, or chips)  Third Meal: meat + rice + beans Snack: dessert Beverages: water  Estimated Energy Needs Calories: 2200 Carbohydrate: 248g Protein: 138g Fat: 73g   NUTRITION DIAGNOSIS  Inadequate energy intake (NI-1.2) related to decreased appetite as evidenced by reported recent unintentional weight loss and diet intake reflecting less than EEN.    NUTRITION INTERVENTION  Nutrition education (E-1) on the following topics:  . High Protein, High Calorie MNT  Handouts Provided Include   High Protein High Calorie Suggestions  Sample Meal Plan (2,200 kcals/day)   [employer]gain Plant Protein Powder Samples  Learning Style & Readiness for Change Teaching method utilized: Visual & Auditory   Demonstrated degree of understanding via: Teach Back  Barriers to learning/adherence to lifestyle change: None Identified     MONITORING & EVALUATION Dietary intake, weekly physical activity, and goals PRN.  Next Steps  Patient is to contact [facility] as needed/desired with questions or to schedule follow up.

## 2021-01-10 IMAGING — DX DG CHEST 2V
2 series · 2 of 2 positions shown · non-contrast
Comparison: 06/10/2016

CLINICAL DATA: Asthma short of breath and wheezing

EXAM:
CHEST - 2 VIEW

[dg chest 2 view (1 of 2)]
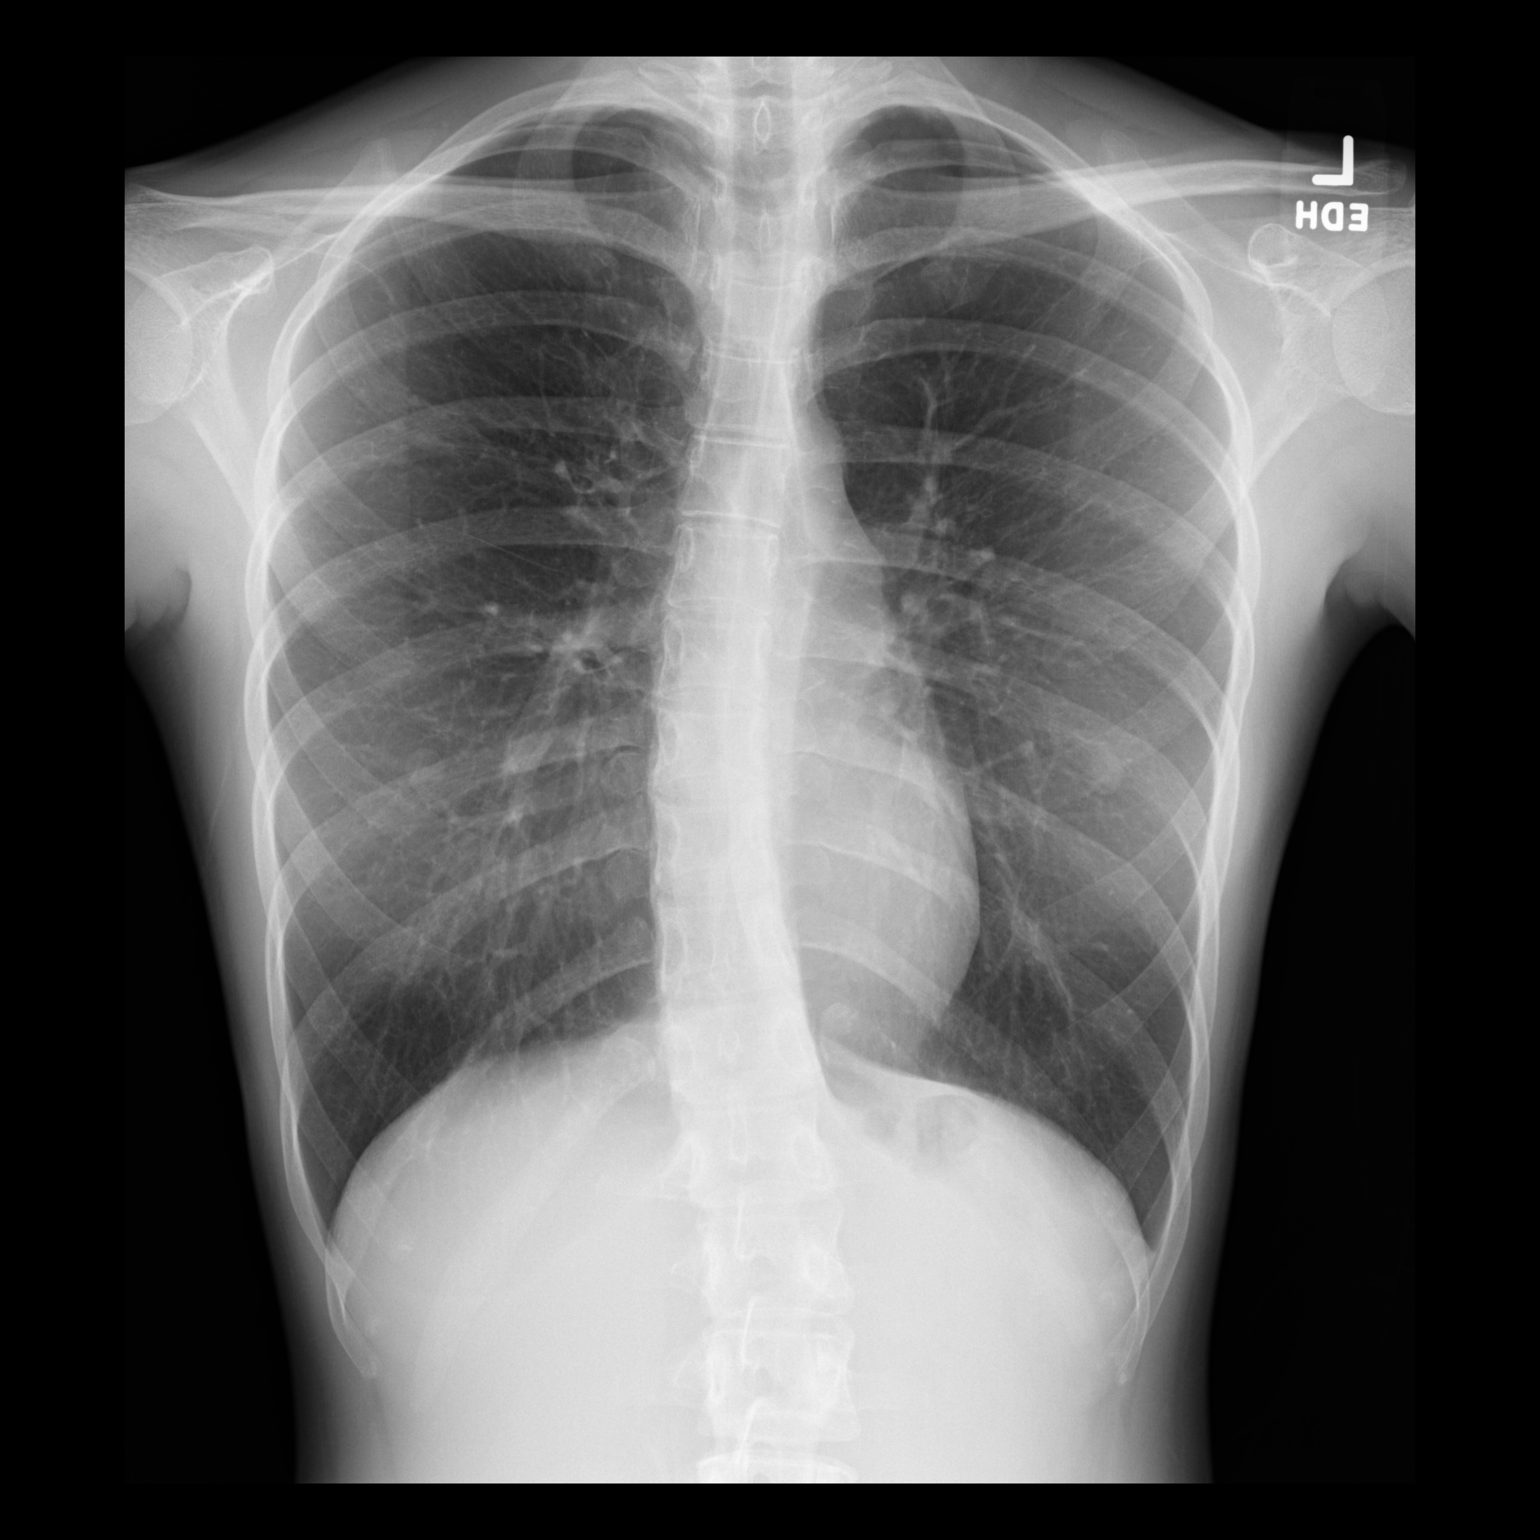

[dg chest 2 view (2 of 2)]
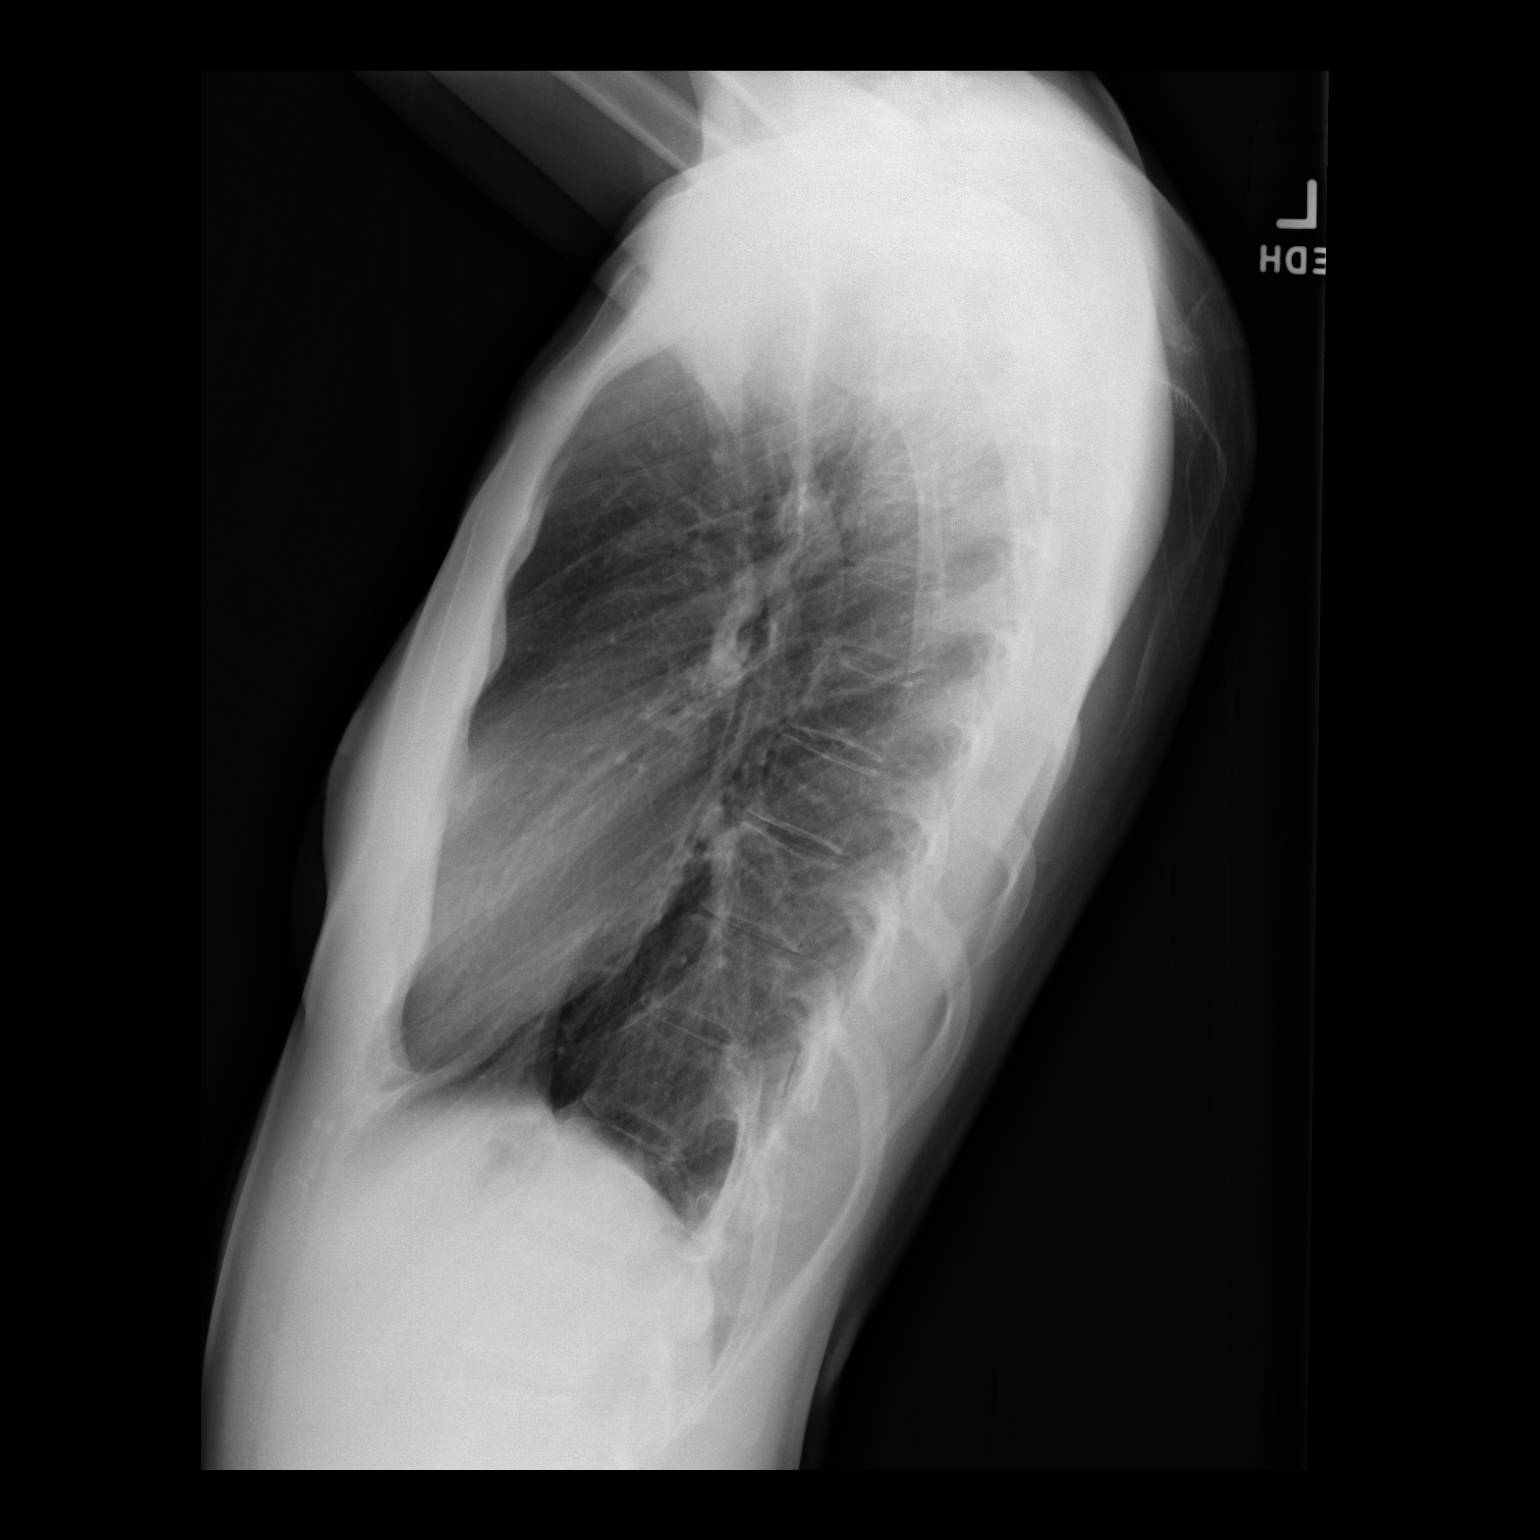

[2 of 2 positions shown; findings below may reference images not displayed]

FINDINGS: The heart size and mediastinal contours are within normal limits.
Both lungs are clear. The visualized skeletal structures are
unremarkable. Symmetrical nodular lower lung opacities likely
reflect nipple shadows.
IMPRESSION: No active cardiopulmonary disease.

## 2021-07-26 ENCOUNTER — Encounter: Payer: Medicaid Other | Admitting: Family Medicine

## 2021-08-16 ENCOUNTER — Ambulatory Visit (INDEPENDENT_AMBULATORY_CARE_PROVIDER_SITE_OTHER): Payer: Medicaid Other | Admitting: Advanced Practice Midwife

## 2021-08-16 ENCOUNTER — Encounter: Payer: Self-pay | Admitting: Advanced Practice Midwife

## 2021-08-16 ENCOUNTER — Other Ambulatory Visit: Payer: Self-pay

## 2021-08-16 ENCOUNTER — Other Ambulatory Visit (HOSPITAL_COMMUNITY)
Admission: RE | Admit: 2021-08-16 | Discharge: 2021-08-16 | Disposition: A | Discharge status: Home / Self Care | Payer: Medicaid Other | Source: Ambulatory Visit | Attending: Family Medicine | Admitting: Family Medicine

## 2021-08-16 VITALS — BP 125/86 | HR 115 | Ht 66.0 in | Wt 115.2 lb

## 2021-08-16 DIAGNOSIS — Z3009 Encounter for other general counseling and advice on contraception: Secondary | ICD-10-CM | POA: Insufficient documentation

## 2021-08-16 DIAGNOSIS — Z01419 Encounter for gynecological examination (general) (routine) without abnormal findings: Secondary | ICD-10-CM

## 2021-08-16 DIAGNOSIS — B3731 Acute candidiasis of vulva and vagina: Secondary | ICD-10-CM | POA: Diagnosis not present

## 2021-08-16 DIAGNOSIS — Z124 Encounter for screening for malignant neoplasm of cervix: Secondary | ICD-10-CM | POA: Diagnosis not present

## 2021-08-16 DIAGNOSIS — Z01411 Encounter for gynecological examination (general) (routine) with abnormal findings: Secondary | ICD-10-CM | POA: Insufficient documentation

## 2021-08-16 NOTE — Patient Instructions (Signed)
Try www.bedsider.org for birth control information.     

## 2021-08-16 NOTE — Progress Notes (Signed)
   Subjective:     Carrie Morton is a 25 y.o. female here at [facility] for a routine exam.  Current complaints: periods are regular but have become more painful and heavier in last couple of years.  She had Nexplanon but did not like side effects. Is using condoms for contraception now. Does not want hormones at this time.  Personal health questionnaire reviewed: yes.  Do you have a primary care provider? yes Do you feel safe at home? yes  Flowsheet Row Nutrition from 05/19/2020 in [facility]  PHQ-2 Total Score 0       Health Maintenance Due  Topic Date Due   Hepatitis C Screening  Never done   TETANUS/TDAP  08/01/2019   PAP-Cervical Cytology Screening  Never done   PAP SMEAR-Modifier  Never done   INFLUENZA VACCINE  06/13/2021     Risk factors for chronic health problems: Smoking: Alchohol/how much: Pt BMI: Body mass index is 18.59 kg/m.   Gynecologic History Patient's last menstrual period was 07/19/2021 (approximate). Contraception: condoms Last Pap: n/a.  Last mammogram: n/a.   Obstetric History OB History  No obstetric history on file.     The following portions of the patient's history were reviewed and updated as appropriate: allergies, current medications, past family history, past medical history, past social history, past surgical history, and problem list.  Review of Systems Pertinent items noted in HPI and remainder of comprehensive ROS otherwise negative.    Objective:   BP 125/86   Pulse (!) 115   Ht 5\' 6"  (1.676 m)   Wt 115 lb 3.2 oz (52.3 kg)   LMP 07/19/2021 (Approximate)   BMI 18.59 kg/m  VS reviewed, nursing note reviewed,  Constitutional: well developed, well nourished, no distress HEENT: normocephalic CV: normal rate Pulm/chest wall: normal effort Breast Exam:  Deferred with low risks and shared decision making, discussed recommendation to start mammogram between 40-50 yo/  Abdomen: soft Neuro:  alert and oriented x 3 Skin: warm, dry Psych: affect normal Pelvic exam: Performed: Cervix pink, visually closed, without lesion, scant white creamy discharge, vaginal walls and external genitalia normal Bimanual exam: Cervix 0/long/high, firm, anterior, neg CMT, uterus nontender, nonenlarged, adnexa without tenderness, enlargement, or mass       Assessment/Plan:   1. Well woman exam --Doing well, does not like heavier periods but does not want hormonal contraception at this time.  --No s/sx of anemia --NSAIDs for pain may improve bleeding  - Cervicovaginal ancillary only( [facility]) - Cytology - PAP( [place])  2. Encounter for screening for cervical cancer   3. Encounter for counseling regarding contraception --Discussed pt contraceptive plans and reviewed contraceptive methods based on pt preferences and effectiveness.  Pt prefers to continue condoms.   Follow up in: 1  year  or as needed.   09/18/2021, CNM 2:47 PM

## 2021-08-17 LAB — CYTOLOGY - PAP
Chlamydia: NEGATIVE
Comment: NEGATIVE
Comment: NORMAL
Diagnosis: NEGATIVE
Neisseria Gonorrhea: NEGATIVE

## 2021-08-17 LAB — CERVICOVAGINAL ANCILLARY ONLY
Bacterial Vaginitis (gardnerella): NEGATIVE
Candida Glabrata: POSITIVE — AB
Candida Vaginitis: NEGATIVE
Comment: NEGATIVE
Comment: NEGATIVE
Comment: NEGATIVE

## 2021-08-17 MED ORDER — FLUCONAZOLE 150 MG PO TABS: 150.0000 mg | ORAL_TABLET | Freq: Once | ORAL | 0 refills | Status: AC

## 2021-08-17 NOTE — Addendum Note (Signed)
Addended by: Sharen Counter A on: 08/17/2021 04:41 PM   Modules accepted: Orders

## 2021-08-18 ENCOUNTER — Encounter: Payer: Medicaid Other | Admitting: Obstetrics & Gynecology

## 2024-09-23 ENCOUNTER — Inpatient Hospital Stay: Admission: RE | Admit: 2024-09-23 | Payer: Self-pay | Source: Ambulatory Visit

## 2025-09-14 ENCOUNTER — Ambulatory Visit: Admitting: Physician Assistant
